# Patient Record
Sex: Female | Born: 2000 | Race: White | Hispanic: No | Marital: Single
Health system: Southern US, Community
[De-identification: ages and names within clinical notes are randomized; demographics above are authoritative.]

## PROBLEM LIST (undated history)

## (undated) DIAGNOSIS — S42009A Fracture of unspecified part of unspecified clavicle, initial encounter for closed fracture: Secondary | ICD-10-CM

## (undated) DIAGNOSIS — T7840XA Allergy, unspecified, initial encounter: Secondary | ICD-10-CM

## (undated) HISTORY — DX: Fracture of unspecified part of unspecified clavicle, initial encounter for closed fracture: S42.009A

## (undated) HISTORY — DX: Allergy, unspecified, initial encounter: T78.40XA

## (undated) HISTORY — PX: WISDOM TOOTH EXTRACTION: SHX21

---
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
## 2020-07-08 DIAGNOSIS — N12 Tubulo-interstitial nephritis, not specified as acute or chronic: Secondary | ICD-10-CM

## 2020-07-08 HISTORY — DX: Tubulo-interstitial nephritis, not specified as acute or chronic: N12

## 2021-05-12 DIAGNOSIS — N921 Excessive and frequent menstruation with irregular cycle: Secondary | ICD-10-CM | POA: Diagnosis not present

## 2021-05-12 DIAGNOSIS — Z3041 Encounter for surveillance of contraceptive pills: Secondary | ICD-10-CM | POA: Diagnosis not present

## 2021-05-18 DIAGNOSIS — Z0000 Encounter for general adult medical examination without abnormal findings: Secondary | ICD-10-CM | POA: Diagnosis not present

## 2021-05-18 DIAGNOSIS — Z00129 Encounter for routine child health examination without abnormal findings: Secondary | ICD-10-CM | POA: Diagnosis not present

## 2021-05-18 DIAGNOSIS — Z713 Dietary counseling and surveillance: Secondary | ICD-10-CM | POA: Diagnosis not present

## 2021-05-18 DIAGNOSIS — Z23 Encounter for immunization: Secondary | ICD-10-CM | POA: Diagnosis not present

## 2021-12-30 DIAGNOSIS — N390 Urinary tract infection, site not specified: Secondary | ICD-10-CM | POA: Diagnosis not present

## 2022-02-04 DIAGNOSIS — Z6828 Body mass index (BMI) 28.0-28.9, adult: Secondary | ICD-10-CM | POA: Diagnosis not present

## 2022-02-04 DIAGNOSIS — N3000 Acute cystitis without hematuria: Secondary | ICD-10-CM | POA: Diagnosis not present

## 2022-02-17 DIAGNOSIS — H1033 Unspecified acute conjunctivitis, bilateral: Secondary | ICD-10-CM | POA: Diagnosis not present

## 2022-06-13 DIAGNOSIS — R3915 Urgency of urination: Secondary | ICD-10-CM | POA: Diagnosis not present

## 2022-06-13 DIAGNOSIS — M545 Low back pain, unspecified: Secondary | ICD-10-CM | POA: Diagnosis not present

## 2022-06-28 ENCOUNTER — Ambulatory Visit
Admission: RE | Admit: 2022-06-28 | Discharge: 2022-06-28 | Disposition: A | Discharge status: Home / Self Care | Payer: BC Managed Care – PPO | Source: Ambulatory Visit | Attending: Urgent Care | Admitting: Urgent Care

## 2022-06-28 ENCOUNTER — Other Ambulatory Visit: Payer: Self-pay

## 2022-06-28 VITALS — BP 117/77 | HR 139 | Temp 101.0°F | Resp 18

## 2022-06-28 DIAGNOSIS — R6889 Other general symptoms and signs: Secondary | ICD-10-CM | POA: Diagnosis not present

## 2022-06-28 DIAGNOSIS — J029 Acute pharyngitis, unspecified: Secondary | ICD-10-CM | POA: Insufficient documentation

## 2022-06-28 DIAGNOSIS — Z20822 Contact with and (suspected) exposure to covid-19: Secondary | ICD-10-CM | POA: Diagnosis not present

## 2022-06-28 DIAGNOSIS — R0981 Nasal congestion: Secondary | ICD-10-CM | POA: Diagnosis not present

## 2022-06-28 DIAGNOSIS — H9203 Otalgia, bilateral: Secondary | ICD-10-CM | POA: Insufficient documentation

## 2022-06-28 LAB — RESP PANEL BY RT-PCR (RSV, FLU A&B, COVID)  RVPGX2
Influenza A by PCR: NEGATIVE
Influenza B by PCR: NEGATIVE
Resp Syncytial Virus by PCR: NEGATIVE
SARS Coronavirus 2 by RT PCR: NEGATIVE

## 2022-06-28 LAB — POCT RAPID STREP A (OFFICE): Rapid Strep A Screen: NEGATIVE

## 2022-06-28 MED ORDER — ACETAMINOPHEN 325 MG PO TABS
650.0000 mg | ORAL_TABLET | Freq: Once | ORAL | Status: AC
  Administered 2022-06-28: 650 mg via ORAL

## 2022-06-28 NOTE — Discharge Instructions (Addendum)
Follow-up here or with your primary care provider if your symptoms do not resolve within 1 week.    

## 2022-06-28 NOTE — ED Provider Notes (Signed)
Roderic Palau    CSN: [id number] Arrival date & time: 06/28/22  0847      History   Chief Complaint Chief Complaint  Patient presents with   Nasal Congestion   Sore Throat   APPT 0930    HPI Katherine Rice is a 21 y.o. female.    Sore Throat    Presents to UC with complaint of symptoms x1 day.  Endorses nasal congestion with clear discharge, productive Cough, Bilateral ear pain, and sore throat yesterday. Fever starting today. Fever in clinic tmax 101.13F.  History reviewed. No pertinent past medical history.  There are no problems to display for this patient.   History reviewed. No pertinent surgical history.  OB History   No obstetric history on file.      Home Medications    Prior to Admission medications   Medication Sig Start Date End Date Taking? Authorizing Provider  cetirizine (ZYRTEC) 10 MG tablet Take 10 mg by mouth daily.   Yes [provider]  ESTARYLLA 0.25-35 MG-MCG tablet Take 1 tablet by mouth daily. 06/05/22  Yes [provider]  fluticasone (FLONASE) 50 MCG/ACT nasal spray USE ONE SPRAY IN EACH NOSTRIL EVERYDAY 03/30/15   [provider]  montelukast (SINGULAIR) 10 MG tablet  03/30/15   [provider]  PULMICORT FLEXHALER 180 MCG/ACT inhaler Inhale 1 puff into the lungs 2 (two) times daily. 01/28/15   [provider]    Family History History reviewed. No pertinent family history.  Social History Social History   Tobacco Use   Smoking status: Never   Smokeless tobacco: Never  Substance Use Topics   Alcohol use: Not Currently   Drug use: Never     Allergies   Patient has no known allergies.   Review of Systems Review of Systems   Physical Exam Triage Vital Signs ED Triage Vitals  Enc Vitals Group     BP 06/28/22 0915 117/77     Pulse Rate 06/28/22 0915 (!) 139     Resp 06/28/22 0915 18     Temp 06/28/22 0915 (!) 101 F (38.3 C)     Temp Source 06/28/22 0915 Oral      SpO2 06/28/22 0915 97 %     Weight --      Height --      Head Circumference --      Peak Flow --      Pain Score 06/28/22 0916 4     Pain Loc --      Pain Edu? --      Excl. in [place]? --    No data found.  Updated Vital Signs BP 117/77 (BP Location: Left Arm)   Pulse (!) 139   Temp (!) 101 F (38.3 C) (Oral)   Resp 18   LMP 06/03/2022 (Exact Date)   SpO2 97%   Visual Acuity Right Eye Distance:   Left Eye Distance:   Bilateral Distance:    Right Eye Near:   Left Eye Near:    Bilateral Near:     Physical Exam Vitals reviewed.  Constitutional:      General: She is not in acute distress.    Appearance: She is well-developed. She is ill-appearing.  HENT:     Head: Normocephalic.     Right Ear: Tympanic membrane normal. No tenderness.     Left Ear: Tympanic membrane normal. No tenderness.     Ears:     Comments: EACs erythematous bilaterally.  Nose: Congestion and rhinorrhea present.     Mouth/Throat:     Mouth: Mucous membranes are moist.     Pharynx: Posterior oropharyngeal erythema present. No oropharyngeal exudate.  Cardiovascular:     Rate and Rhythm: Normal rate and regular rhythm.     Heart sounds: Normal heart sounds.  Pulmonary:     Effort: Pulmonary effort is normal.     Breath sounds: Normal breath sounds.  Musculoskeletal:     Cervical back: Normal range of motion and neck supple.  Lymphadenopathy:     Cervical: No cervical adenopathy.  Skin:    General: Skin is warm and dry.  Neurological:     General: No focal deficit present.     Mental Status: She is alert and oriented to person, place, and time.  Psychiatric:        Mood and Affect: Mood normal.        Behavior: Behavior normal.      UC Treatments / Results  Labs (all labs ordered are listed, but only abnormal results are displayed) Labs Reviewed  POCT RAPID STREP A (OFFICE)    EKG   Radiology No results found.  Procedures Procedures (including critical care  time)  Medications Ordered in UC Medications  acetaminophen (TYLENOL) tablet 650 mg (650 mg Oral Given 06/28/22 0918)    Initial Impression / Assessment and Plan / UC Course  I have reviewed the triage vital signs and the nursing notes.  Pertinent labs & imaging results that were available during my care of the patient were reviewed by me and considered in my medical decision making (see chart for details).   Rapid strep is negative suspect viral URI.  Respiratory swab obtained for COVID/flu/RSV and pending.  Patient may continue OTC medication for symptom control.  Should quarantine until respiratory results are available.   Final Clinical Impressions(s) / UC Diagnoses   Final diagnoses:  None   Discharge Instructions   None    ED Prescriptions   None    PDMP not reviewed this encounter.   Charma Igo, [place] 06/28/22 [phone]

## 2022-06-28 NOTE — ED Triage Notes (Addendum)
Patient c/o nasal congestion and sore throat x 1 day.   Patient endorses fever that started today.   Patient endorses fatigue. Patient endorses painful swallowing.   Patient endorses bilateral ear pain.   Patient endorses COVID exposure at job.   Patient has taken Tylenol (last dose was 0100 last night).

## 2022-07-10 DIAGNOSIS — Z3041 Encounter for surveillance of contraceptive pills: Secondary | ICD-10-CM | POA: Diagnosis not present

## 2022-07-10 DIAGNOSIS — Z124 Encounter for screening for malignant neoplasm of cervix: Secondary | ICD-10-CM | POA: Diagnosis not present

## 2022-07-10 DIAGNOSIS — Z01411 Encounter for gynecological examination (general) (routine) with abnormal findings: Secondary | ICD-10-CM | POA: Diagnosis not present

## 2022-08-31 ENCOUNTER — Ambulatory Visit
Admission: RE | Admit: 2022-08-31 | Discharge: 2022-08-31 | Disposition: A | Discharge status: Home / Self Care | Payer: BC Managed Care – PPO | Source: Ambulatory Visit

## 2022-08-31 VITALS — BP 101/68 | HR 110 | Temp 98.4°F

## 2022-08-31 DIAGNOSIS — J019 Acute sinusitis, unspecified: Secondary | ICD-10-CM | POA: Diagnosis not present

## 2022-08-31 DIAGNOSIS — B9689 Other specified bacterial agents as the cause of diseases classified elsewhere: Secondary | ICD-10-CM

## 2022-08-31 MED ORDER — AMOXICILLIN-POT CLAVULANATE 875-125 MG PO TABS: 1.0000 | ORAL_TABLET | Freq: Two times a day (BID) | ORAL | 0 refills | Status: AC

## 2022-08-31 MED ORDER — PREDNISONE 20 MG PO TABS: ORAL_TABLET | ORAL | 0 refills | Status: AC

## 2022-08-31 NOTE — ED Provider Notes (Signed)
Renaldo Fiddler    CSN: [id number] Arrival date & time: 08/31/22  1721      History   Chief Complaint Chief Complaint  Patient presents with   Nasal Congestion    Pressure in head and ears. Hard to swallow, a lot of drainage. Chest feels tight. - Entered by patient   sinus pressure    HPI Katherine Rice is a 21 y.o. female.   HPI  Patient presents to urgent care with complaint of nasal drainage and report of ear pressure and sinus pressure since September.  She states she had a sinus infection recurred.  Patient was treated on 06/28/2022 for complaint of flulike symptoms.  She was discharged with recommendation for over-the-counter medication and reports her symptoms improved.  She states symptoms recurred on and off and worsening over the past 2 weeks.  She endorses pressure in her head and ears, reports difficult swallowing because of sore throat, cough that is sometimes productive.  History reviewed. No pertinent past medical history.  There are no problems to display for this patient.   History reviewed. No pertinent surgical history.  OB History   No obstetric history on file.      Home Medications    Prior to Admission medications   Medication Sig Start Date End Date Taking? Authorizing Provider  meloxicam (MOBIC) 15 MG tablet Take 1 tablet every day by oral route. 08/06/18  Yes [provider]  norethindrone-ethinyl estradiol-iron (LOESTRIN FE 1.5/30) 1.5-30 MG-MCG tablet Take by mouth. 03/02/21  Yes [provider]  amoxicillin-clavulanate (AUGMENTIN) 875-125 MG tablet TAKE 1 TABLET BY MOUTH 2 (TWO) TIMES A DAY FOR 7 DAYS.    [provider]  cetirizine (ZYRTEC) 10 MG tablet Take 10 mg by mouth daily.    [provider]  ESTARYLLA 0.25-35 MG-MCG tablet Take 1 tablet by mouth daily. 06/05/22   [provider]  fluticasone (FLONASE) 50 MCG/ACT nasal spray USE ONE SPRAY IN EACH NOSTRIL EVERYDAY 03/30/15    [provider]  montelukast (SINGULAIR) 10 MG tablet  03/30/15   [provider]  PULMICORT FLEXHALER 180 MCG/ACT inhaler Inhale 1 puff into the lungs 2 (two) times daily. 01/28/15   [provider]    Family History History reviewed. No pertinent family history.  Social History Social History   Tobacco Use   Smoking status: Never   Smokeless tobacco: Never  Substance Use Topics   Alcohol use: Not Currently   Drug use: Never     Allergies   Patient has no known allergies.   Review of Systems Review of Systems   Physical Exam Triage Vital Signs ED Triage Vitals  Enc Vitals Group     BP 08/31/22 1755 101/68     Pulse Rate 08/31/22 1755 (!) 110     Resp --      Temp 08/31/22 1755 98.4 F (36.9 C)     Temp src --      SpO2 08/31/22 1755 98 %     Weight --      Height --      Head Circumference --      Peak Flow --      Pain Score 08/31/22 1756 0     Pain Loc --      Pain Edu? --      Excl. in [place]? --    No data found.  Updated Vital Signs BP 101/68   Pulse (!) 110   Temp 98.4 F (36.9  C)   LMP 08/24/2022 (Approximate)   SpO2 98%   Visual Acuity Right Eye Distance:   Left Eye Distance:   Bilateral Distance:    Right Eye Near:   Left Eye Near:    Bilateral Near:     Physical Exam Vitals reviewed.  Constitutional:      Appearance: Normal appearance.  HENT:     Right Ear: There is no impacted cerumen. Tympanic membrane is bulging.     Left Ear: There is no impacted cerumen. Tympanic membrane is bulging.     Nose: Congestion present.     Right Sinus: Maxillary sinus tenderness and frontal sinus tenderness present.     Left Sinus: Maxillary sinus tenderness and frontal sinus tenderness present.     Mouth/Throat:     Mouth: Mucous membranes are moist.     Pharynx: Posterior oropharyngeal erythema present. No oropharyngeal exudate.  Eyes:     Conjunctiva/sclera: Conjunctivae normal.     Pupils: Pupils are equal, round, and  reactive to light.  Cardiovascular:     Rate and Rhythm: Normal rate and regular rhythm.     Pulses: Normal pulses.     Heart sounds: Normal heart sounds.  Pulmonary:     Effort: Pulmonary effort is normal.     Breath sounds: Normal breath sounds.  Skin:    General: Skin is warm and dry.  Neurological:     General: No focal deficit present.     Mental Status: She is alert and oriented to person, place, and time.  Psychiatric:        Mood and Affect: Mood normal.        Behavior: Behavior normal.      UC Treatments / Results  Labs (all labs ordered are listed, but only abnormal results are displayed) Labs Reviewed - No data to display  EKG   Radiology No results found.  Procedures Procedures (including critical care time)  Medications Ordered in UC Medications - No data to display  Initial Impression / Assessment and Plan / UC Course  I have reviewed the triage vital signs and the nursing notes.  Pertinent labs & imaging results that were available during my care of the patient were reviewed by me and considered in my medical decision making (see chart for details).   Concern for acute/recurrent bacterial sinusitis.  She is positive for maxillary and frontal sinus tenderness.  TMs are bulging though not erythematous.    Will prescribe extended course of Augmentin given her history of recurrent sinusitis as well as prednisone to reduce sinus inflammation.  Patient is advised to delay initiation of prednisone until tomorrow morning to reduce risk of sleep disturbance.   Final Clinical Impressions(s) / UC Diagnoses   Final diagnoses:  None   Discharge Instructions   None    ED Prescriptions   None    PDMP not reviewed this encounter.   Charma Igo, [place] 08/31/22 1817

## 2022-08-31 NOTE — ED Triage Notes (Signed)
Pt. Presents to UC w/ c/o nasal drainage and complaints of ear pressure and sinus pressure since September. Pt. States she has had a sinus infection since September that has not cleared.

## 2022-08-31 NOTE — Discharge Instructions (Signed)
Follow up here or with your primary care provider if your symptoms are worsening or not improving with treatment.     

## 2022-11-30 ENCOUNTER — Ambulatory Visit
Admission: RE | Admit: 2022-11-30 | Discharge: 2022-11-30 | Disposition: A | Discharge status: Home / Self Care | Payer: BC Managed Care – PPO | Source: Ambulatory Visit | Attending: Emergency Medicine | Admitting: Emergency Medicine

## 2022-11-30 VITALS — BP 133/84 | HR 89 | Temp 98.5°F | Resp 18

## 2022-11-30 DIAGNOSIS — J0100 Acute maxillary sinusitis, unspecified: Secondary | ICD-10-CM | POA: Diagnosis not present

## 2022-11-30 MED ORDER — AMOXICILLIN 875 MG PO TABS: 875.0000 mg | ORAL_TABLET | Freq: Two times a day (BID) | ORAL | 0 refills | Status: AC

## 2022-11-30 NOTE — ED Provider Notes (Signed)
Roderic Palau    CSN: [id number] Arrival date & time: 11/30/22  0845      History   Chief Complaint Chief Complaint  Patient presents with   Nasal Congestion    I have major head congestion and the lymph nodes in my neck are a little swollen. - Entered by patient    HPI Katherine Rice is a 22 y.o. female.  Patient presents with 10 day history of sinus pressure, congestion, postnasal drip, runny nose, cough, nausea.  No fever, sore throat, shortness of breath, vomiting, diarrhea, or other symptoms.  Treating symptoms with Sudafed.    The history is provided by the patient and medical records.    History reviewed. No pertinent past medical history.  There are no problems to display for this patient.   History reviewed. No pertinent surgical history.  OB History   No obstetric history on file.      Home Medications    Prior to Admission medications   Medication Sig Start Date End Date Taking? Authorizing Provider  amoxicillin (AMOXIL) 875 MG tablet Take 1 tablet (875 mg total) by mouth 2 (two) times daily for 10 days. 11/30/22 12/10/22 Yes Sharion Balloon, NP  cetirizine (ZYRTEC) 10 MG tablet Take 10 mg by mouth daily.    [provider]  ESTARYLLA 0.25-35 MG-MCG tablet Take 1 tablet by mouth daily. 06/05/22   [provider]  fluticasone (FLONASE) 50 MCG/ACT nasal spray USE ONE SPRAY IN EACH NOSTRIL EVERYDAY 03/30/15   [provider]  meloxicam (MOBIC) 15 MG tablet Take 1 tablet every day by oral route. 08/06/18   [provider]  montelukast (SINGULAIR) 10 MG tablet  03/30/15   [provider]  norethindrone-ethinyl estradiol-iron (LOESTRIN FE 1.5/30) 1.5-30 MG-MCG tablet Take by mouth. 03/02/21   [provider]  PULMICORT FLEXHALER 180 MCG/ACT inhaler Inhale 1 puff into the lungs 2 (two) times daily. 01/28/15   [provider]    Family History No family history on file.  Social History Social  History   Tobacco Use   Smoking status: Never   Smokeless tobacco: Never  Substance Use Topics   Alcohol use: Not Currently   Drug use: Never     Allergies   Patient has no known allergies.   Review of Systems Review of Systems  Constitutional:  Negative for chills and fever.  HENT:  Positive for congestion, postnasal drip, rhinorrhea and sinus pressure. Negative for ear pain and sore throat.   Respiratory:  Positive for cough. Negative for shortness of breath.   Cardiovascular:  Negative for chest pain and palpitations.  Gastrointestinal:  Positive for nausea. Negative for abdominal pain, diarrhea and vomiting.  Skin:  Negative for color change and rash.  All other systems reviewed and are negative.    Physical Exam Triage Vital Signs ED Triage Vitals [11/30/22 0854]  Enc Vitals Group     BP 133/84     Pulse Rate 89     Resp 18     Temp 98.5 F (36.9 C)     Temp src      SpO2 98 %     Weight      Height      Head Circumference      Peak Flow      Pain Score 6     Pain Loc      Pain Edu?      Excl. in [place]?    No data found.  Updated Vital Signs BP 133/84   Pulse 89   Temp 98.5 F (36.9 C)   Resp 18   LMP 11/18/2022   SpO2 98%   Visual Acuity Right Eye Distance:   Left Eye Distance:   Bilateral Distance:    Right Eye Near:   Left Eye Near:    Bilateral Near:     Physical Exam Vitals and nursing note reviewed.  Constitutional:      General: She is not in acute distress.    Appearance: Normal appearance. She is well-developed. She is not ill-appearing.  HENT:     Right Ear: Tympanic membrane normal.     Left Ear: Tympanic membrane normal.     Nose: Congestion and rhinorrhea present.     Mouth/Throat:     Mouth: Mucous membranes are moist.     Pharynx: Oropharynx is clear.  Cardiovascular:     Rate and Rhythm: Normal rate and regular rhythm.     Heart sounds: Normal heart sounds.  Pulmonary:     Effort: Pulmonary effort is normal. No  respiratory distress.     Breath sounds: Normal breath sounds.  Musculoskeletal:     Cervical back: Neck supple.  Skin:    General: Skin is warm and dry.  Neurological:     Mental Status: She is alert.  Psychiatric:        Mood and Affect: Mood normal.        Behavior: Behavior normal.      UC Treatments / Results  Labs (all labs ordered are listed, but only abnormal results are displayed) Labs Reviewed - No data to display  EKG   Radiology No results found.  Procedures Procedures (including critical care time)  Medications Ordered in UC Medications - No data to display  Initial Impression / Assessment and Plan / UC Course  I have reviewed the triage vital signs and the nursing notes.  Pertinent labs & imaging results that were available during my care of the patient were reviewed by me and considered in my medical decision making (see chart for details).    Acute sinusitis.  Patient has been symptomatic for 10 days and is not improving with OTC treatment.  Treating today with amoxicillin.  Tylenol or ibuprofen as needed, Mucinex.  Instructed patient to follow up with her PCP if her symptoms are not improving.  She agrees to plan of care.    Final Clinical Impressions(s) / UC Diagnoses   Final diagnoses:  Acute non-recurrent maxillary sinusitis     Discharge Instructions      Take the amoxicillin as directed.  Follow up with your primary care provider if your symptoms are not improving.        ED Prescriptions     Medication Sig Dispense Auth. Provider   amoxicillin (AMOXIL) 875 MG tablet Take 1 tablet (875 mg total) by mouth 2 (two) times daily for 10 days. 20 tablet Sharion Balloon, NP      PDMP not reviewed this encounter.   Sharion Balloon, NP 11/30/22 [phone]

## 2022-11-30 NOTE — Discharge Instructions (Addendum)
Take the amoxicillin as directed.  Follow up with your primary care provider if your symptoms are not improving.   ° ° °

## 2022-11-30 NOTE — ED Triage Notes (Signed)
Patient to Urgent Care with complaints of lymph node swelling and sinus pressure/ nasal congestion. Nausea. Productive cough.   Reports symptoms started approx 10 days ago. Denies any known fevers.   Has been taking sudafed extra strength.

## 2023-01-04 DIAGNOSIS — H00014 Hordeolum externum left upper eyelid: Secondary | ICD-10-CM | POA: Diagnosis not present

## 2023-01-28 DIAGNOSIS — R109 Unspecified abdominal pain: Secondary | ICD-10-CM | POA: Diagnosis not present

## 2023-01-28 DIAGNOSIS — R3915 Urgency of urination: Secondary | ICD-10-CM | POA: Diagnosis not present

## 2023-01-28 DIAGNOSIS — Z87448 Personal history of other diseases of urinary system: Secondary | ICD-10-CM | POA: Diagnosis not present

## 2023-04-22 ENCOUNTER — Ambulatory Visit: Payer: BC Managed Care – PPO | Admitting: Family

## 2023-04-22 ENCOUNTER — Encounter: Payer: Self-pay | Admitting: Family

## 2023-04-22 VITALS — BP 122/76 | HR 78 | Temp 98.1°F | Ht 67.5 in | Wt 192.0 lb

## 2023-04-22 DIAGNOSIS — R1900 Intra-abdominal and pelvic swelling, mass and lump, unspecified site: Secondary | ICD-10-CM | POA: Diagnosis not present

## 2023-04-22 DIAGNOSIS — Z7689 Persons encountering health services in other specified circumstances: Secondary | ICD-10-CM

## 2023-04-22 DIAGNOSIS — Z0000 Encounter for general adult medical examination without abnormal findings: Secondary | ICD-10-CM | POA: Insufficient documentation

## 2023-04-22 HISTORY — DX: Persons encountering health services in other specified circumstances: Z76.89

## 2023-04-22 NOTE — Progress Notes (Signed)
Assessment & Plan:  Encounter to establish care Assessment & Plan: Past medical and family history reviewed today.  Baseline labs ordered as requested by patient  Orders: -     Ambulatory referral to Dermatology -     CBC with Differential/Platelet; Future -     Comprehensive metabolic panel; Future -     Hemoglobin A1c; Future -     Lipid panel; Future -     TSH; Future -     VITAMIN D 25 Hydroxy (Vit-D Deficiency, Fractures); Future  Flank mass Assessment & Plan: Previously evaluated ultrasound in 2017 and thought to be a lipoma.  Fortunately this area is nontender.  We agreed to have baseline ultrasound to see if is changed/ grown in size.   Orders: -     US Abdomen Limited; Future     Return precautions given.   Risks, benefits, and alternatives of the medications and treatment plan prescribed today were discussed, and patient expressed understanding.   Education regarding symptom management and diagnosis given to patient on AVS either electronically or printed.  Return in about 1 year (around 04/21/2024) for Complete Physical Exam, Fasting labs in 2-3 weeks.  [name], FNP  Subjective:    Patient ID: Katherine Rice, female    DOB: Sep 24, 2001, 22 y.o.   MRN: [id number]  CC: Katherine Rice is a 22 y.o. female who presents today to establish care.    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 Reports previous ultrasound done.  She doesn't think that it is any bigger. No pain unless dog may jump on area  No constipation, abdominal pain, fever, night sweats  US abdomen 10/03/2016  9 x 7 x 9 mm hyperechoic subcutaneous mass. This likely reflects a small lipoma or hemangioma but is incompletely characterized. If there is further clinical concern, recommend better characterization with MRI without and with intravenous contrast.  Family h/o melanoma. Followed in the past with dermatology  She follows with GYN, CNM at [facility].   Patient would  like baseline screening labs today  Allergies: Patient has no known allergies. Current Outpatient Medications on File Prior to Visit  Medication Sig Dispense Refill   cetirizine (ZYRTEC) 10 MG tablet Take 10 mg by mouth daily.     ESTARYLLA 0.25-35 MG-MCG tablet Take 1 tablet by mouth daily.     fluticasone (FLONASE) 50 MCG/ACT nasal spray USE ONE SPRAY IN EACH NOSTRIL EVERYDAY  12   No current facility-administered medications on file prior to visit.    Review of Systems  Constitutional:  Negative for chills and fever.  Respiratory:  Negative for cough.   Cardiovascular:  Negative for chest pain and palpitations.  Gastrointestinal:  Negative for abdominal distention, anal bleeding, constipation, nausea and vomiting.      Objective:    BP 122/76   Pulse 78   Temp 98.1 F (36.7 C)   Ht 5' 7.5" (1.715 m)   Wt 192 lb (87.1 kg)   SpO2 99%   BMI 29.63 kg/m  BP Readings from Last 3 Encounters:  04/22/23 122/76  11/30/22 133/84  08/31/22 101/68   Wt Readings from Last 3 Encounters:  04/22/23 192 lb (87.1 kg)  04/05/15 146 lb (66.2 kg) (90%, Z= 1.27)*   * Growth percentiles are based on CDC (Girls, 2-20 Years) data.    Physical Exam Vitals reviewed.  Constitutional:      Appearance: She is well-developed.  Eyes:     Conjunctiva/sclera: Conjunctivae  normal.  Cardiovascular:     Rate and Rhythm: Normal rate and regular rhythm.     Pulses: Normal pulses.     Heart sounds: Normal heart sounds.  Pulmonary:     Effort: Pulmonary effort is normal.     Breath sounds: Normal breath sounds. No wheezing, rhonchi or rales.  Abdominal:       Comments: Subcentimeter nontender discrete palpable mass left flank.  Skin:    General: Skin is warm and dry.  Neurological:     Mental Status: She is alert.  Psychiatric:        Speech: Speech normal.        Behavior: Behavior normal.        Thought Content: Thought content normal.

## 2023-04-22 NOTE — Assessment & Plan Note (Signed)
Previously evaluated ultrasound in 2017 and thought to be a lipoma.  Fortunately this area is nontender.  We agreed to have baseline ultrasound to see if is changed/ grown in size.

## 2023-04-22 NOTE — Assessment & Plan Note (Signed)
Past medical and family history reviewed today.  Baseline labs ordered as requested by patient

## 2023-04-22 NOTE — Patient Instructions (Signed)
I have ordered an ultrasound to evaluate soft tissue mass of your abdomen  Let us know if you dont hear back within a week in regards to an appointment being scheduled.   So that you are aware, if you are [facility] MyChart user , please pay attention to your MyChart messages as you may receive a MyChart message with a phone number to call and schedule this test/appointment own your own from our referral coordinator. This is a new process so I do not want you to miss this message.  If you are not a MyChart user, you will receive a phone call.  Nice to meet you!

## 2023-04-26 ENCOUNTER — Ambulatory Visit
Admission: RE | Admit: 2023-04-26 | Discharge: 2023-04-26 | Disposition: A | Discharge status: Home / Self Care | Payer: BC Managed Care – PPO | Source: Ambulatory Visit | Attending: Family | Admitting: Family

## 2023-04-26 DIAGNOSIS — R1909 Other intra-abdominal and pelvic swelling, mass and lump: Secondary | ICD-10-CM | POA: Diagnosis not present

## 2023-04-26 DIAGNOSIS — R1900 Intra-abdominal and pelvic swelling, mass and lump, unspecified site: Secondary | ICD-10-CM | POA: Diagnosis not present

## 2023-05-07 ENCOUNTER — Other Ambulatory Visit (INDEPENDENT_AMBULATORY_CARE_PROVIDER_SITE_OTHER): Payer: BC Managed Care – PPO

## 2023-05-07 DIAGNOSIS — Z131 Encounter for screening for diabetes mellitus: Secondary | ICD-10-CM | POA: Diagnosis not present

## 2023-05-07 DIAGNOSIS — Z7689 Persons encountering health services in other specified circumstances: Secondary | ICD-10-CM

## 2023-05-07 DIAGNOSIS — Z0000 Encounter for general adult medical examination without abnormal findings: Secondary | ICD-10-CM

## 2023-05-07 DIAGNOSIS — Z136 Encounter for screening for cardiovascular disorders: Secondary | ICD-10-CM | POA: Diagnosis not present

## 2023-05-07 LAB — LIPID PANEL
Cholesterol: 209 mg/dL — ABNORMAL HIGH (ref 0–200)
HDL: 56.4 mg/dL (ref >39.00)
LDL Cholesterol: 134 mg/dL — ABNORMAL HIGH (ref 0–99)
NonHDL: 152.63
Total CHOL/HDL Ratio: 4
Triglycerides: 94 mg/dL (ref 0.0–149.0)
VLDL: 18.8 mg/dL (ref 0.0–40.0)

## 2023-05-07 LAB — CBC WITH DIFFERENTIAL/PLATELET
Basophils Absolute: 0 10*3/uL (ref 0.0–0.1)
Basophils Relative: 0.5 % (ref 0.0–3.0)
Eosinophils Absolute: 0.1 10*3/uL (ref 0.0–0.7)
Eosinophils Relative: 2.4 % (ref 0.0–5.0)
HCT: 41.4 % (ref 36.0–46.0)
Hemoglobin: 13.9 g/dL (ref 12.0–15.0)
Lymphocytes Relative: 39.3 % (ref 12.0–46.0)
Lymphs Abs: 2.2 10*3/uL (ref 0.7–4.0)
MCHC: 33.6 g/dL (ref 30.0–36.0)
MCV: 88.6 fl (ref 78.0–100.0)
Monocytes Absolute: 0.6 10*3/uL (ref 0.1–1.0)
Monocytes Relative: 10.7 % (ref 3.0–12.0)
Neutro Abs: 2.7 10*3/uL (ref 1.4–7.7)
Neutrophils Relative %: 47.1 % (ref 43.0–77.0)
Platelets: 222 10*3/uL (ref 150.0–400.0)
RBC: 4.67 Mil/uL (ref 3.87–5.11)
RDW: 12.7 % (ref 11.5–15.5)
WBC: 5.7 10*3/uL (ref 4.0–10.5)

## 2023-05-07 LAB — COMPREHENSIVE METABOLIC PANEL
ALT: 12 U/L (ref 0–35)
AST: 15 U/L (ref 0–37)
Albumin: 4.4 g/dL (ref 3.5–5.2)
Alkaline Phosphatase: 61 U/L (ref 39–117)
BUN: 12 mg/dL (ref 6–23)
CO2: 28 mEq/L (ref 19–32)
Calcium: 9.6 mg/dL (ref 8.4–10.5)
Chloride: 102 mEq/L (ref 96–112)
Creatinine, Ser: 0.78 mg/dL (ref 0.40–1.20)
GFR: 107.86 mL/min (ref >60.00)
Glucose, Bld: 80 mg/dL (ref 70–99)
Potassium: 3.7 mEq/L (ref 3.5–5.1)
Sodium: 138 mEq/L (ref 135–145)
Total Bilirubin: 0.5 mg/dL (ref 0.2–1.2)
Total Protein: 7.5 g/dL (ref 6.0–8.3)

## 2023-05-07 LAB — HEMOGLOBIN A1C: Hgb A1c MFr Bld: 5.3 % (ref 4.6–6.5)

## 2023-05-07 LAB — TSH: TSH: 2.32 u[IU]/mL (ref 0.35–5.50)

## 2023-05-07 LAB — VITAMIN D 25 HYDROXY (VIT D DEFICIENCY, FRACTURES): VITD: 45.47 ng/mL (ref 30.00–100.00)

## 2023-09-20 ENCOUNTER — Telehealth: Payer: BC Managed Care – PPO | Admitting: Physician Assistant

## 2023-09-20 DIAGNOSIS — B354 Tinea corporis: Secondary | ICD-10-CM

## 2023-09-20 MED ORDER — MICONAZOLE NITRATE 2 % EX CREA: 1.0000 | TOPICAL_CREAM | Freq: Two times a day (BID) | CUTANEOUS | 0 refills | Status: AC

## 2023-09-20 NOTE — Patient Instructions (Signed)
Katherine Rice, thank you for joining Margaretann Loveless, PA-C for today's virtual visit.  While this provider is not your primary care provider (PCP), if your PCP is located in our provider database this encounter information will be shared with them immediately following your visit.   A [facility] MyChart account gives you access to today's visit and all your visits, tests, and labs performed at [facility] " click here if you don't have a [facility] MyChart account or go to [email or web address]  Consent: (Patient) Katherine Rice provided verbal consent for this virtual visit at the beginning of the encounter.  Current Medications:  Current Outpatient Medications:    miconazole (MICOTIN) 2 % cream, Apply 1 Application topically 2 (two) times daily., Disp: 28.35 g, Rfl: 0   cetirizine (ZYRTEC) 10 MG tablet, Take 10 mg by mouth daily., Disp: , Rfl:    ESTARYLLA 0.25-35 MG-MCG tablet, Take 1 tablet by mouth daily., Disp: , Rfl:    fluticasone (FLONASE) 50 MCG/ACT nasal spray, USE ONE SPRAY IN EACH NOSTRIL EVERYDAY, Disp: , Rfl: 12   Medications ordered in this encounter:  Meds ordered this encounter  Medications   miconazole (MICOTIN) 2 % cream    Sig: Apply 1 Application topically 2 (two) times daily.    Dispense:  28.35 g    Refill:  0    Supervising Provider:   Merrilee Jansky [[id number]]     *If you need refills on other medications prior to your next appointment, please contact your pharmacy*  Follow-Up: Call back or seek an in-person evaluation if the symptoms worsen or if the condition fails to improve as anticipated.  [facility] [phone]  Other Instructions Body Ringworm Body ringworm is an infection of the skin that often causes a ring-shaped rash. Body ringworm is also called tinea corporis. Body ringworm can affect any part of your skin. This condition is easily spread from person to person (is very contagious). What are  the causes? This condition is caused by fungi called dermatophytes. The condition develops when these fungi grow out of control on the skin. You can get this condition if you touch a person or animal that has it. You can also get it if you share any items with an infected person or pet. These include: Clothing, bedding, and towels. Brushes or combs. Gym equipment. Any other object that has the fungus on it. What increases the risk? You are more likely to develop this condition if you: Play sports that involve close physical contact, such as wrestling. Sweat a lot. Live in areas that are hot and humid. Use public showers. Have a weakened disease-fighting system (immune system). What are the signs or symptoms? Symptoms of this condition include: Itchy, raised red spots and bumps. Red scaly patches. A ring-shaped rash. The rash may have: A clear center. Scales or red bumps at its center. Redness near its borders. Dry and scaly skin on or around it. How is this diagnosed? This condition can usually be diagnosed with a skin exam. A skin scraping may be taken from the affected area and examined under a microscope to see if the fungus is present. How is this treated? This condition may be treated with: An antifungal cream or ointment. An antifungal shampoo. Antifungal medicines. These may be prescribed if your ringworm: Is severe. Keeps coming back or lasts a long time. Follow these instructions at home: Take over-the-counter and prescription medicines only as told by your health care  provider. If you were given an antifungal cream or ointment: Use it as told by your health care provider. Wash the infected area and dry it completely before applying the cream or ointment. If you were given an antifungal shampoo: Use it as told by your health care provider. Leave the shampoo on your body for 3-5 minutes before rinsing. While you have a rash: Wear loose clothing to stop clothes from  rubbing and irritating it. Wash or change your bed sheets every night. Wash clothes and bed sheets in hot water. Disinfect or throw out items that may be infected. Wash your hands often with soap and water for at least 20 seconds. If soap and water are not available, use hand sanitizer. If your pet has the same infection, take your pet to see a veterinarian for treatment. How is this prevented? Take a bath or shower every day and after every time you work out or play sports. Dry your skin completely after bathing. Wear sandals or shoes in public places and showers. Wash athletic clothes after each use. Do not share personal items with others. Avoid touching red patches of skin on other people. Avoid touching pets that have bald spots. If you touch an animal that has a bald spot, wash your hands. Contact a health care provider if: Your rash continues to spread after 7 days of treatment. Your rash is not gone in 4 weeks. The area around your rash gets red, warm, tender, and swollen. This information is not intended to replace advice given to you by your health care provider. Make sure you discuss any questions you have with your health care provider. Document Revised: 03/08/2022 Document Reviewed: 03/08/2022 Elsevier Patient Education  2024 Elsevier Inc.    If you have been instructed to have an in-person evaluation today at a local Urgent Care facility, please use the link below. It will take you to a list of all of our available [facility], including address, phone number and hours of operation. Please do not delay care.  [facility]  If you or a family member do not have a primary care provider, use the link below to schedule a visit and establish care. When you choose a [facility] primary care physician or advanced practice provider, you gain a long-term partner in health. Find a Primary Care Provider  Learn more about [facility]'s in-office and virtual  care options: [facility] - Get Care Now

## 2023-09-20 NOTE — Progress Notes (Signed)
Virtual Visit Consent   Katherine Rice, you are scheduled for a virtual visit with a Katherine Rice provider today. Just as with appointments in the office, your consent must be obtained to participate. Your consent will be active for this visit and any virtual visit you may have with one of our providers in the next 365 days. If you have a MyChart account, a copy of this consent can be sent to you electronically.  As this is a virtual visit, video technology does not allow for your provider to perform a traditional examination. This may limit your provider's ability to fully assess your condition. If your provider identifies any concerns that need to be evaluated in person or the need to arrange testing (such as labs, EKG, etc.), we will make arrangements to do so. Although advances in technology are sophisticated, we cannot ensure that it will always work on either your end or our end. If the connection with a video visit is poor, the visit may have to be switched to a telephone visit. With either a video or telephone visit, we are not always able to ensure that we have a secure connection.  By engaging in this virtual visit, you consent to the provision of healthcare and authorize for your insurance to be billed (if applicable) for the services provided during this visit. Depending on your insurance coverage, you may receive a charge related to this service.  I need to obtain your verbal consent now. Are you willing to proceed with your visit today? Katherine Rice has provided verbal consent on 09/20/2023 for a virtual visit (video or telephone). Margaretann Loveless, PA-C  Date: 09/20/2023 3:11 PM  Virtual Visit via Video Note   I, Margaretann Loveless, connected with  Katherine Rice  ([id number], 2001/01/07) on 09/20/23 at  3:15 PM EST by a video-enabled telemedicine application and verified that I am speaking with the correct person using two identifiers.  Location: Patient: Virtual Visit  Location Patient: Home Provider: Virtual Visit Location Provider: Home Office   I discussed the limitations of evaluation and management by telemedicine and the availability of in person appointments. The patient expressed understanding and agreed to proceed.    History of Present Illness: Katherine Rice is a 22 y.o. who identifies as a female who was assigned female at birth, and is being seen today for rash.  HPI: Rash This is a new problem. The affected locations include the chest. The rash is characterized by redness (flat lesion with raised borders; skin feels rough and dry; does not itch). It is unknown if there was an exposure to a precipitant. Pertinent negatives include no anorexia, congestion, cough, fatigue, fever or shortness of breath. Past treatments include nothing. The treatment provided no relief.     Problems:  Patient Active Problem List   Diagnosis Date Noted   Encounter to establish care 04/22/2023   Flank mass 04/22/2023    Allergies: No Known Allergies Medications:  Current Outpatient Medications:    cetirizine (ZYRTEC) 10 MG tablet, Take 10 mg by mouth daily., Disp: , Rfl:    ESTARYLLA 0.25-35 MG-MCG tablet, Take 1 tablet by mouth daily., Disp: , Rfl:    fluticasone (FLONASE) 50 MCG/ACT nasal spray, USE ONE SPRAY IN EACH NOSTRIL EVERYDAY, Disp: , Rfl: 12  Observations/Objective: Patient is well-developed, well-nourished in no acute distress.  Resting comfortably at home.  Head is normocephalic, atraumatic.  No labored breathing.  Speech is clear and coherent with logical content.  Patient is alert and oriented at baseline.  One well-demarcated, annular lesion noted on chest with a raised edge and central clearing  Assessment and Plan: There are no diagnoses linked to this encounter. - Suspect ringworm - Topical Miconazole Prescribed - Keep skin clean and dry - If not improving in 10-14 days can reach back out for oral Terbinafine if needed - Seek in  person evaluation if worsening  Follow Up Instructions: I discussed the assessment and treatment plan with the patient. The patient was provided an opportunity to ask questions and all were answered. The patient agreed with the plan and demonstrated an understanding of the instructions.  A copy of instructions were sent to the patient via MyChart unless otherwise noted below.    The patient was advised to call back or seek an in-person evaluation if the symptoms worsen or if the condition fails to improve as anticipated.    Margaretann Loveless, PA-C

## 2023-10-21 DIAGNOSIS — Z1331 Encounter for screening for depression: Secondary | ICD-10-CM | POA: Diagnosis not present

## 2023-10-21 DIAGNOSIS — Z1339 Encounter for screening examination for other mental health and behavioral disorders: Secondary | ICD-10-CM | POA: Diagnosis not present

## 2023-10-21 DIAGNOSIS — Z01419 Encounter for gynecological examination (general) (routine) without abnormal findings: Secondary | ICD-10-CM | POA: Diagnosis not present

## 2023-11-07 ENCOUNTER — Ambulatory Visit: Payer: BC Managed Care – PPO | Admitting: Dermatology

## 2024-03-20 ENCOUNTER — Ambulatory Visit
Admission: RE | Admit: 2024-03-20 | Discharge: 2024-03-20 | Disposition: A | Discharge status: Home / Self Care | Source: Ambulatory Visit | Attending: Emergency Medicine | Admitting: Emergency Medicine

## 2024-03-20 VITALS — BP 129/80 | HR 77 | Temp 98.0°F | Resp 14 | Ht 67.5 in | Wt 190.0 lb

## 2024-03-20 DIAGNOSIS — R3915 Urgency of urination: Secondary | ICD-10-CM | POA: Diagnosis not present

## 2024-03-20 DIAGNOSIS — R350 Frequency of micturition: Secondary | ICD-10-CM

## 2024-03-20 DIAGNOSIS — M545 Low back pain, unspecified: Secondary | ICD-10-CM | POA: Diagnosis not present

## 2024-03-20 LAB — URINALYSIS, W/ REFLEX TO CULTURE (INFECTION SUSPECTED)
Bacteria, UA: NONE SEEN
Bilirubin Urine: NEGATIVE
Glucose, UA: NEGATIVE mg/dL
Hgb urine dipstick: NEGATIVE
Ketones, ur: NEGATIVE mg/dL
Leukocytes,Ua: NEGATIVE
Nitrite: NEGATIVE
Protein, ur: NEGATIVE mg/dL
Specific Gravity, Urine: 1.01 (ref 1.005–1.030)
WBC, UA: NONE SEEN WBC/hpf (ref 0–5)
pH: 6.5 (ref 5.0–8.0)

## 2024-03-20 NOTE — ED Triage Notes (Signed)
 Patient c/o urinary urgency and frequency, lower back pain, and bladder pressure that started yesterday.

## 2024-03-20 NOTE — ED Provider Notes (Signed)
 [facility]    CSN: [id number] Arrival date & time: 03/20/24  0903      History   Chief Complaint Chief Complaint  Patient presents with   Urinary Frequency    Appointment   Back Pain    HPI Katherine Rice is a 23 y.o. female.   23 year old female, Katherine Rice, presents to urgent care for evaluation of urinary urgency and frequency lower back pain and bladder pressure that started yesterday.  Last menstrual period was 03/13/2024  PMH: Pyelonephritis  The history is provided by the patient. No language interpreter was used.    Past Medical History:  Diagnosis Date   Allergy    Seasonal   Clavicle fracture    at 23 years old   Encounter to establish care 04/22/2023   Pyelonephritis 07/08/2020    Patient Active Problem List   Diagnosis Date Noted   Urinary frequency 03/20/2024   Urinary urgency 03/20/2024   Acute bilateral low back pain without sciatica 03/20/2024   Encounter to establish care 04/22/2023   Flank mass 04/22/2023    Past Surgical History:  Procedure Laterality Date   WISDOM TOOTH EXTRACTION Bilateral     OB History   No obstetric history on file.      Home Medications    Prior to Admission medications   Medication Sig Start Date End Date Taking? Authorizing Provider  ESTARYLLA 0.25-35 MG-MCG tablet Take 1 tablet by mouth daily. 06/05/22  Yes [provider]  cetirizine (ZYRTEC) 10 MG tablet Take 10 mg by mouth daily.    [provider]  fluticasone (FLONASE) 50 MCG/ACT nasal spray USE ONE SPRAY IN EACH NOSTRIL EVERYDAY 03/30/15   [provider]  miconazole  (MICOTIN) 2 % cream Apply 1 Application topically 2 (two) times daily. 09/20/23   Angelia Kelp, PA-C    Family History Family History  Problem Relation Age of Onset   Melanoma Mother    Obesity Mother    Diabetes Father    Hypertension Father    Obesity Father    Arthritis Maternal Grandmother    Depression Maternal Grandmother     Diabetes Maternal Grandmother    Miscarriages / Stillbirths Maternal Grandmother    Obesity Maternal Grandmother    Early death Maternal Grandfather    Heart disease Maternal Grandfather    Hypertension Maternal Grandfather    Obesity Maternal Grandfather    Hypertension Paternal Grandmother    Alcohol abuse Paternal Grandfather    Heart disease Paternal Grandfather    Hypertension Paternal Grandfather    Obesity Paternal Grandfather    Stroke Paternal Grandfather    Early death Maternal Aunt     Social History Social History   Tobacco Use   Smoking status: Never   Smokeless tobacco: Never  Vaping Use   Vaping status: Never Used  Substance Use Topics   Alcohol use: Yes    Alcohol/week: 3.0 standard drinks of alcohol    Types: 3 Standard drinks or equivalent per week    Comment: on the weekend   Drug use: Never     Allergies   Patient has no known allergies.   Review of Systems Review of Systems  Constitutional:  Negative for fever.  Genitourinary:  Positive for frequency and urgency.  Musculoskeletal:  Positive for back pain.  All other systems reviewed and are negative.    Physical Exam Triage Vital Signs ED Triage Vitals  Encounter Vitals Group     BP 03/20/24 0920 129/80  Girls Systolic BP Percentile --      Girls Diastolic BP Percentile --      Boys Systolic BP Percentile --      Boys Diastolic BP Percentile --      Pulse Rate 03/20/24 0920 77     Resp 03/20/24 0920 14     Temp 03/20/24 0920 98 F (36.7 C)     Temp Source 03/20/24 0920 Oral     SpO2 03/20/24 0920 96 %     Weight 03/20/24 0918 190 lb (86.2 kg)     Height 03/20/24 0918 5' 7.5 (1.715 m)     Head Circumference --      Peak Flow --      Pain Score 03/20/24 0917 5     Pain Loc --      Pain Education --      Exclude from Growth Chart --    No data found.  Updated Vital Signs BP 129/80 (BP Location: Right Arm)   Pulse 77   Temp 98 F (36.7 C) (Oral)   Resp 14   Ht 5'  7.5 (1.715 m)   Wt 190 lb (86.2 kg)   LMP 03/13/2024 (Approximate)   SpO2 96%   BMI 29.32 kg/m   Visual Acuity Right Eye Distance:   Left Eye Distance:   Bilateral Distance:    Right Eye Near:   Left Eye Near:    Bilateral Near:     Physical Exam Vitals and nursing note reviewed.  Constitutional:      General: She is not in acute distress.    Appearance: Normal appearance. She is well-developed and well-groomed.  HENT:     Head: Normocephalic.   Eyes:     Pupils: Pupils are equal, round, and reactive to light.    Cardiovascular:     Rate and Rhythm: Normal rate and regular rhythm.     Heart sounds: Normal heart sounds.  Pulmonary:     Effort: Pulmonary effort is normal.  Abdominal:     General: Bowel sounds are normal.     Tenderness: There is abdominal tenderness in the suprapubic area.   Musculoskeletal:        General: Normal range of motion.     Cervical back: Normal range of motion.     Lumbar back: Tenderness present.     Comments: Lumbar paraspinal tenderness, no step offs   Skin:    General: Skin is warm and dry.   Neurological:     General: No focal deficit present.     Mental Status: She is alert and oriented to person, place, and time.     GCS: GCS eye subscore is 4. GCS verbal subscore is 5. GCS motor subscore is 6.     Gait: Gait is intact.   Psychiatric:        Speech: Speech normal.        Behavior: Behavior normal. Behavior is cooperative.      UC Treatments / Results  Labs (all labs ordered are listed, but only abnormal results are displayed) Labs Reviewed  URINALYSIS, W/ REFLEX TO CULTURE (INFECTION SUSPECTED) - Abnormal; Notable for the following components:      Result Value   Color, Urine STRAW (*)    All other components within normal limits    EKG   Radiology No results found.  Procedures Procedures (including critical care time)  Medications Ordered in UC Medications - No data to display  Initial Impression /  Assessment  and Plan / UC Course  I have reviewed the triage vital signs and the nursing notes.  Pertinent labs & imaging results that were available during my care of the patient were reviewed by me and considered in my medical decision making (see chart for details).    Discussed negative urine results and plan of care with patient, strict go to ER precautions given , patient verbalized understanding to this provider.  Ddx: Urinary frequency, urinary urgency, bilateral low back pain without sciatica Final Clinical Impressions(s) / UC Diagnoses   Final diagnoses:  Urinary frequency  Urinary urgency  Acute bilateral low back pain without sciatica     Discharge Instructions      Your urine was normal, negative for uti Push fluids Avoid caffeine May take Tylenol  as label directed for any back pain, may use heat or ice to the back 20 minutes 3 times a day. Follow up with PCP If you develop fever, muscle aches, worsening symptoms, loss of bowel and bladder ,etc. go to the emergency room for further evaluation.     ED Prescriptions   None    PDMP not reviewed this encounter.   Peter [employer]ands, NP 03/20/24 1753

## 2024-03-20 NOTE — Discharge Instructions (Signed)
 Your urine was normal, negative for uti Push fluids Avoid caffeine May take Tylenol  as label directed for any back pain, may use heat or ice to the back 20 minutes 3 times a day. Follow up with PCP If you develop fever, muscle aches, worsening symptoms, loss of bowel and bladder ,etc. go to the emergency room for further evaluation.

## 2024-04-24 ENCOUNTER — Ambulatory Visit (INDEPENDENT_AMBULATORY_CARE_PROVIDER_SITE_OTHER): Payer: BC Managed Care – PPO | Admitting: Family

## 2024-04-24 ENCOUNTER — Encounter: Payer: Self-pay | Admitting: Family

## 2024-04-24 VITALS — BP 120/78 | HR 78 | Temp 98.0°F | Resp 20 | Ht 67.75 in | Wt 204.4 lb

## 2024-04-24 DIAGNOSIS — Z1159 Encounter for screening for other viral diseases: Secondary | ICD-10-CM | POA: Diagnosis not present

## 2024-04-24 DIAGNOSIS — G479 Sleep disorder, unspecified: Secondary | ICD-10-CM

## 2024-04-24 DIAGNOSIS — Z114 Encounter for screening for human immunodeficiency virus [HIV]: Secondary | ICD-10-CM | POA: Diagnosis not present

## 2024-04-24 DIAGNOSIS — Z0000 Encounter for general adult medical examination without abnormal findings: Secondary | ICD-10-CM | POA: Diagnosis not present

## 2024-04-24 LAB — TSH: TSH: 2.79 u[IU]/mL (ref 0.35–5.50)

## 2024-04-24 LAB — CBC WITH DIFFERENTIAL/PLATELET
Basophils Absolute: 0 K/uL (ref 0.0–0.1)
Basophils Relative: 0.4 % (ref 0.0–3.0)
Eosinophils Absolute: 0.1 K/uL (ref 0.0–0.7)
Eosinophils Relative: 1.3 % (ref 0.0–5.0)
HCT: 39.1 % (ref 36.0–46.0)
Hemoglobin: 13.4 g/dL (ref 12.0–15.0)
Lymphocytes Relative: 27.4 % (ref 12.0–46.0)
Lymphs Abs: 1.3 K/uL (ref 0.7–4.0)
MCHC: 34.3 g/dL (ref 30.0–36.0)
MCV: 85.5 fl (ref 78.0–100.0)
Monocytes Absolute: 0.3 K/uL (ref 0.1–1.0)
Monocytes Relative: 7.2 % (ref 3.0–12.0)
Neutro Abs: 2.9 K/uL (ref 1.4–7.7)
Neutrophils Relative %: 63.7 % (ref 43.0–77.0)
Platelets: 243 K/uL (ref 150.0–400.0)
RBC: 4.58 Mil/uL (ref 3.87–5.11)
RDW: 12.5 % (ref 11.5–15.5)
WBC: 4.6 K/uL (ref 4.0–10.5)

## 2024-04-24 LAB — COMPREHENSIVE METABOLIC PANEL WITH GFR
ALT: 8 U/L (ref 0–35)
AST: 12 U/L (ref 0–37)
Albumin: 4.4 g/dL (ref 3.5–5.2)
Alkaline Phosphatase: 67 U/L (ref 39–117)
BUN: 10 mg/dL (ref 6–23)
CO2: 27 meq/L (ref 19–32)
Calcium: 9.3 mg/dL (ref 8.4–10.5)
Chloride: 103 meq/L (ref 96–112)
Creatinine, Ser: 0.66 mg/dL (ref 0.40–1.20)
GFR: 123.73 mL/min (ref >60.00)
Glucose, Bld: 82 mg/dL (ref 70–99)
Potassium: 4.2 meq/L (ref 3.5–5.1)
Sodium: 139 meq/L (ref 135–145)
Total Bilirubin: 0.6 mg/dL (ref 0.2–1.2)
Total Protein: 7.2 g/dL (ref 6.0–8.3)

## 2024-04-24 LAB — LIPID PANEL
Cholesterol: 216 mg/dL — ABNORMAL HIGH (ref 0–200)
HDL: 52.8 mg/dL (ref >39.00)
LDL Cholesterol: 137 mg/dL — ABNORMAL HIGH (ref 0–99)
NonHDL: 163.42
Total CHOL/HDL Ratio: 4
Triglycerides: 133 mg/dL (ref 0.0–149.0)
VLDL: 26.6 mg/dL (ref 0.0–40.0)

## 2024-04-24 LAB — VITAMIN D 25 HYDROXY (VIT D DEFICIENCY, FRACTURES): VITD: 35.07 ng/mL (ref 30.00–100.00)

## 2024-04-24 NOTE — Progress Notes (Signed)
 Assessment & Plan:  Annual physical exam Assessment & Plan: Clinical breast exam performed.  Deferred pelvic exam in the absence of complaints and Pap smear is up-to-date with [facility].  Encouraged exercise  Orders: -     CBC with Differential/Platelet -     Comprehensive metabolic panel with GFR -     Lipid panel -     TSH -     VITAMIN D  25 Hydroxy (Vit-D Deficiency, Fractures) -     HIV Antibody (routine testing w rflx) -     Hepatitis C antibody  Encounter for screening for HIV -     HIV Antibody (routine testing w rflx)  Encounter for hepatitis C screening test for low risk patient -     Hepatitis C antibody  Trouble in sleeping Assessment & Plan: New.  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  Counseled on exercise, overall sleep hygiene and trial of melatonin combination medication.  She will let me know if she is doing.      Return precautions given.   Risks, benefits, and alternatives of the medications and treatment plan prescribed today were discussed, and patient expressed understanding.   Education regarding symptom management and diagnosis given to patient on AVS either electronically or printed.  No follow-ups on file.  Rollene Northern, FNP  Subjective:    Patient ID: Katherine Rice, female    DOB: 2001-01-04, 23 y.o.   MRN: [id number]  CC: Katherine Rice is a 23 y.o. female who presents today for physical exam.    HPI: She complains of episodic trouble falling asleep She may fall asleep at 3am and gets up at 9am.  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  She is scrolling on her phone at night No regular exercise during the summer months.     Family h/o melanoma; she follows with dermatology   NO FDR relative with breast or colon cancer  Cervical Cancer Screening: UTD, follows with [facility],  pap  07/10/2022, NILM         Tetanus - UTD         Exercise: No regular exercise over the summer.  Alcohol use:  on the weekend Smoking/tobacco use: Nonsmoker.    Health Maintenance  Topic Date Due   COVID-19 Vaccine (4 - 2024-25 season) 06/09/2023   Flu Shot  05/08/2024   Pap Smear  07/08/2025   DTaP/Tdap/Td vaccine (8 - Td or Tdap) 05/19/2031   Hepatitis B Vaccine  Completed   HPV Vaccine  Completed   Hepatitis C Screening  Completed   HIV Screening  Completed   Meningitis B Vaccine  Completed    ALLERGIES: Patient has no known allergies.  Current Outpatient Medications on File Prior to Visit  Medication Sig Dispense Refill   cetirizine (ZYRTEC) 10 MG tablet Take 10 mg by mouth daily.     ESTARYLLA 0.25-35 MG-MCG tablet Take 1 tablet by mouth daily.     fluticasone (FLONASE) 50 MCG/ACT nasal spray USE ONE SPRAY IN EACH NOSTRIL EVERYDAY  12   miconazole  (MICOTIN) 2 % cream Apply 1 Application topically 2 (two) times daily. 28.35 g 0   No current facility-administered medications on file prior to visit.    Review of Systems  Constitutional:  Negative for chills and fever.  Respiratory:  Negative for cough.   Cardiovascular:  Negative for  chest pain and palpitations.  Gastrointestinal:  Negative for nausea and vomiting.  Psychiatric/Behavioral:  Positive for sleep disturbance.       Objective:    BP 120/78   Pulse 78   Temp 98 F (36.7 C)   Resp 20   Ht 5' 7.75 (1.721 m)   Wt 204 lb 6 oz (92.7 kg)   LMP 04/04/2024 (Exact Date)   SpO2 98%   BMI 31.30 kg/m   BP Readings from Last 3 Encounters:  04/24/24 120/78  03/20/24 129/80  04/22/23 122/76   Wt Readings from Last 3 Encounters:  04/24/24 204 lb 6 oz (92.7 kg)  03/20/24 190 lb (86.2 kg)  04/22/23 192 lb (87.1 kg)    Physical Exam

## 2024-04-24 NOTE — Patient Instructions (Signed)
 Essentials for good sleep:   #1 Exercise #2 Limit Caffeine ( no caffeine after lunch) #3 No smart phones, TV prior to bed -- BLUE light is VERY activating and send the brain an 'awake message.'  #4 Go to bed at same time of night each night and get up at same time of day.  #5 Take 0.5 to 5mg  melatonin at 7pm with dinner -this is when natural melatonin will start to increase You may also trial melatonin combination over the counter supplement such as Sleep#3 ( L- theanine, camomile, lavender, valerian root, and melatonin 10mg )   or Qunol Sleep 5 in 1 (melatonin 5mg , Ashwagandha, GABA, valerian root, L-theanine)  Health Maintenance, Female Adopting a healthy lifestyle and getting preventive care are important in promoting health and wellness. Ask your health care provider about: The right schedule for you to have regular tests and exams. Things you can do on your own to prevent diseases and keep yourself healthy. What should I know about diet, weight, and exercise? Eat a healthy diet  Eat a diet that includes plenty of vegetables, fruits, low-fat dairy products, and lean protein. Do not eat a lot of foods that are high in solid fats, added sugars, or sodium. Maintain a healthy weight Body mass index (BMI) is used to identify weight problems. It estimates body fat based on height and weight. Your health care provider can help determine your BMI and help you achieve or maintain a healthy weight. Get regular exercise Get regular exercise. This is one of the most important things you can do for your health. Most adults should: Exercise for at least 150 minutes each week. The exercise should increase your heart rate and make you sweat (moderate-intensity exercise). Do strengthening exercises at least twice a week. This is in addition to the moderate-intensity exercise. Spend less time sitting. Even light physical activity can be beneficial. Watch cholesterol and blood lipids Have your blood tested  for lipids and cholesterol at 23 years of age, then have this test every 5 years. Have your cholesterol levels checked more often if: Your lipid or cholesterol levels are high. You are older than 23 years of age. You are at high risk for heart disease. What should I know about cancer screening? Depending on your health history and family history, you may need to have cancer screening at various ages. This may include screening for: Breast cancer. Cervical cancer. Colorectal cancer. Skin cancer. Lung cancer. What should I know about heart disease, diabetes, and high blood pressure? Blood pressure and heart disease High blood pressure causes heart disease and increases the risk of stroke. This is more likely to develop in people who have high blood pressure readings or are overweight. Have your blood pressure checked: Every 3-5 years if you are 2-76 years of age. Every year if you are 1 years old or older. Diabetes Have regular diabetes screenings. This checks your fasting blood sugar level. Have the screening done: Once every three years after age 20 if you are at a normal weight and have a low risk for diabetes. More often and at a younger age if you are overweight or have a high risk for diabetes. What should I know about preventing infection? Hepatitis B If you have a higher risk for hepatitis B, you should be screened for this virus. Talk with your health care provider to find out if you are at risk for hepatitis B infection. Hepatitis C Testing is recommended for: Everyone born from [birth year] through  40. Anyone with known risk factors for hepatitis C. Sexually transmitted infections (STIs) Get screened for STIs, including gonorrhea and chlamydia, if: You are sexually active and are younger than 23 years of age. You are older than 23 years of age and your health care provider tells you that you are at risk for this type of infection. Your sexual activity has changed since you were  last screened, and you are at increased risk for chlamydia or gonorrhea. Ask your health care provider if you are at risk. Ask your health care provider about whether you are at high risk for HIV. Your health care provider may recommend a prescription medicine to help prevent HIV infection. If you choose to take medicine to prevent HIV, you should first get tested for HIV. You should then be tested every 3 months for as long as you are taking the medicine. Pregnancy If you are about to stop having your period (premenopausal) and you may become pregnant, seek counseling before you get pregnant. Take 400 to 800 micrograms (mcg) of folic acid every day if you become pregnant. Ask for birth control (contraception) if you want to prevent pregnancy. Osteoporosis and menopause Osteoporosis is a disease in which the bones lose minerals and strength with aging. This can result in bone fractures. If you are 16 years old or older, or if you are at risk for osteoporosis and fractures, ask your health care provider if you should: Be screened for bone loss. Take a calcium or vitamin D  supplement to lower your risk of fractures. Be given hormone replacement therapy (HRT) to treat symptoms of menopause. Follow these instructions at home: Alcohol use Do not drink alcohol if: Your health care provider tells you not to drink. You are pregnant, may be pregnant, or are planning to become pregnant. If you drink alcohol: Limit how much you have to: 0-1 drink a day. Know how much alcohol is in your drink. In the [place] one drink equals one 12 oz bottle of beer (355 mL), one 5 oz glass of wine (148 mL), or one 1 oz glass of hard liquor (44 mL). Lifestyle Do not use any products that contain nicotine or tobacco. These products include cigarettes, chewing tobacco, and vaping devices, such as e-cigarettes. If you need help quitting, ask your health care provider. Do not use street drugs. Do not share needles. Ask your  health care provider for help if you need support or information about quitting drugs. General instructions Schedule regular health, dental, and eye exams. Stay current with your vaccines. Tell your health care provider if: You often feel depressed. You have ever been abused or do not feel safe at home. Summary Adopting a healthy lifestyle and getting preventive care are important in promoting health and wellness. Follow your health care provider's instructions about healthy diet, exercising, and getting tested or screened for diseases. Follow your health care provider's instructions on monitoring your cholesterol and blood pressure. This information is not intended to replace advice given to you by your health care provider. Make sure you discuss any questions you have with your health care provider. Document Revised: 02/13/2021 Document Reviewed: 02/13/2021 Elsevier Patient Education  2024 [employer].

## 2024-04-25 LAB — HEPATITIS C ANTIBODY: Hepatitis C Ab: NONREACTIVE

## 2024-04-25 LAB — HIV ANTIBODY (ROUTINE TESTING W REFLEX): HIV 1&2 Ab, 4th Generation: NONREACTIVE

## 2024-04-26 DIAGNOSIS — G479 Sleep disorder, unspecified: Secondary | ICD-10-CM | POA: Insufficient documentation

## 2024-04-26 NOTE — Assessment & Plan Note (Signed)
 New.  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  Counseled on exercise, overall sleep hygiene and trial of melatonin combination medication.  She will let me know if she is doing.

## 2024-04-26 NOTE — Assessment & Plan Note (Addendum)
 Clinical breast exam performed.  Deferred pelvic exam in the absence of complaints and Pap smear is up-to-date with [facility].  Encouraged exercise

## 2024-04-27 ENCOUNTER — Ambulatory Visit: Payer: Self-pay | Admitting: Family

## 2024-06-09 ENCOUNTER — Ambulatory Visit
Admission: RE | Admit: 2024-06-09 | Discharge: 2024-06-09 | Disposition: A | Discharge status: Home / Self Care | Attending: Emergency Medicine | Admitting: Emergency Medicine

## 2024-06-09 VITALS — BP 110/79 | HR 90 | Temp 98.9°F | Resp 18 | Ht 67.5 in | Wt 190.0 lb

## 2024-06-09 DIAGNOSIS — R051 Acute cough: Secondary | ICD-10-CM | POA: Diagnosis not present

## 2024-06-09 DIAGNOSIS — U071 COVID-19: Secondary | ICD-10-CM

## 2024-06-09 LAB — POC COVID19/FLU A&B COMBO
Covid Antigen, POC: POSITIVE — AB
Influenza A Antigen, POC: NEGATIVE
Influenza B Antigen, POC: NEGATIVE

## 2024-06-09 NOTE — ED Triage Notes (Signed)
 Patient c/o nasal drainage, nausea, diarrhea, cough and fever x 2 days.  Patient works at an [employer].  Patient has taken Mucinex, Tylenol  and Nyquil.

## 2024-06-09 NOTE — ED Provider Notes (Signed)
 Katherine Rice    CSN: [id number] Arrival date & time: 06/09/24  1245      History   Chief Complaint Chief Complaint  Patient presents with   Cough    stuffy nose, fever, nausea, - Entered by patient    HPI Katherine Rice is a 23 y.o. female.  Patient presents with 2-day history of fever, postnasal drip, cough, nausea, diarrhea.  Tmax 100.5.  No OTC medications taken today but previously treating with OTC cold medication.  No ear pain, sore throat, shortness of breath, vomiting.  The history is provided by the patient and medical records.    Past Medical History:  Diagnosis Date   Allergy    Seasonal   Clavicle fracture    at 23 years old   Encounter to establish care 04/22/2023   Pyelonephritis 07/08/2020    Patient Active Problem List   Diagnosis Date Noted   Trouble in sleeping 04/26/2024   Urinary frequency 03/20/2024   Urinary urgency 03/20/2024   Acute bilateral low back pain without sciatica 03/20/2024   Annual physical exam 04/22/2023   Flank mass 04/22/2023    Past Surgical History:  Procedure Laterality Date   WISDOM TOOTH EXTRACTION Bilateral     OB History   No obstetric history on file.      Home Medications    Prior to Admission medications   Medication Sig Start Date End Date Taking? Authorizing Provider  cetirizine (ZYRTEC) 10 MG tablet Take 10 mg by mouth daily.   Yes [provider]  ESTARYLLA 0.25-35 MG-MCG tablet Take 1 tablet by mouth daily. 06/05/22  Yes [provider]  fluticasone (FLONASE) 50 MCG/ACT nasal spray USE ONE SPRAY IN EACH NOSTRIL EVERYDAY 03/30/15   [provider]  miconazole  (MICOTIN) 2 % cream Apply 1 Application topically 2 (two) times daily. 09/20/23   Vivienne Delon HERO, PA-C    Family History Family History  Problem Relation Age of Onset   Melanoma Mother    Obesity Mother    Diabetes Father    Hypertension Father    Obesity Father    Arthritis Maternal Grandmother     Depression Maternal Grandmother    Diabetes Maternal Grandmother    Miscarriages / Stillbirths Maternal Grandmother    Obesity Maternal Grandmother    Early death Maternal Grandfather    Heart disease Maternal Grandfather    Hypertension Maternal Grandfather    Obesity Maternal Grandfather    Hypertension Paternal Grandmother    Alcohol abuse Paternal Grandfather    Heart disease Paternal Grandfather    Hypertension Paternal Grandfather    Obesity Paternal Grandfather    Stroke Paternal Grandfather    Early death Maternal Aunt     Social History Social History   Tobacco Use   Smoking status: Never   Smokeless tobacco: Never  Vaping Use   Vaping status: Never Used  Substance Use Topics   Alcohol use: Yes    Alcohol/week: 3.0 standard drinks of alcohol    Types: 3 Standard drinks or equivalent per week    Comment: on the weekend   Drug use: Never     Allergies   Patient has no known allergies.   Review of Systems Review of Systems  Constitutional:  Positive for fever. Negative for chills.  HENT:  Positive for postnasal drip. Negative for ear pain and sore throat.   Respiratory:  Positive for cough. Negative for shortness of breath.   Gastrointestinal:  Positive for diarrhea and  nausea. Negative for abdominal pain and vomiting.     Physical Exam Triage Vital Signs ED Triage Vitals  Encounter Vitals Group     BP      Girls Systolic BP Percentile      Girls Diastolic BP Percentile      Boys Systolic BP Percentile      Boys Diastolic BP Percentile      Pulse      Resp      Temp      Temp src      SpO2      Weight      Height      Head Circumference      Peak Flow      Pain Score      Pain Loc      Pain Education      Exclude from Growth Chart    No data found.  Updated Vital Signs BP 110/79 (BP Location: Left Arm)   Pulse 90   Temp 98.9 F (37.2 C) (Oral)   Resp 18   Ht 5' 7.5 (1.715 m)   Wt 190 lb (86.2 kg)   LMP 06/01/2024   SpO2 96%    BMI 29.32 kg/m   Visual Acuity Right Eye Distance:   Left Eye Distance:   Bilateral Distance:    Right Eye Near:   Left Eye Near:    Bilateral Near:     Physical Exam Constitutional:      General: She is not in acute distress. HENT:     Right Ear: Tympanic membrane normal.     Left Ear: Tympanic membrane normal.     Nose: Nose normal.     Mouth/Throat:     Mouth: Mucous membranes are moist.     Pharynx: Oropharynx is clear.  Cardiovascular:     Rate and Rhythm: Normal rate and regular rhythm.     Heart sounds: Normal heart sounds.  Pulmonary:     Effort: Pulmonary effort is normal. No respiratory distress.     Breath sounds: Normal breath sounds.  Abdominal:     General: Bowel sounds are normal.     Palpations: Abdomen is soft.     Tenderness: There is no abdominal tenderness. There is no right CVA tenderness, left CVA tenderness, guarding or rebound.  Neurological:     Mental Status: She is alert.      UC Treatments / Results  Labs (all labs ordered are listed, but only abnormal results are displayed) Labs Reviewed  POC COVID19/FLU A&B COMBO - Abnormal; Notable for the following components:      Result Value   Covid Antigen, POC Positive (*)    All other components within normal limits    EKG   Radiology No results found.  Procedures Procedures (including critical care time)  Medications Ordered in UC Medications - No data to display  Initial Impression / Assessment and Plan / UC Course  I have reviewed the triage vital signs and the nursing notes.  Pertinent labs & imaging results that were available during my care of the patient were reviewed by me and considered in my medical decision making (see chart for details).    Cough, COVID-19.  Rapid COVID is positive.  Flu negative.  Afebrile and vital signs are stable.  Discussed symptomatic treatment including Tylenol  or ibuprofen as needed, plain Mucinex as needed, rest, hydration.  Education provided  on COVID.  ED precautions discussed.  Instructed patient to follow-up with her  PCP if she is not improving.  She agrees to plan of care.  Final Clinical Impressions(s) / UC Diagnoses   Final diagnoses:  Acute cough  COVID-19     Discharge Instructions      The COVID is positive.  Flu test is negative.   Take Tylenol  or ibuprofen as needed for fever or discomfort.  Take plain Mucinex as needed for congestion.  Rest and keep yourself hydrated.    Follow-up with your primary care provider if your symptoms are not improving.         ED Prescriptions   None    PDMP not reviewed this encounter.   Corlis Burnard DEL, NP 06/09/24 [phone]

## 2024-06-09 NOTE — Discharge Instructions (Addendum)
 The COVID is positive.  Flu test is negative.   Take Tylenol  or ibuprofen as needed for fever or discomfort.  Take plain Mucinex as needed for congestion.  Rest and keep yourself hydrated.    Follow-up with your primary care provider if your symptoms are not improving.

## 2025-04-26 ENCOUNTER — Encounter: Admitting: Family
